# Patient Record
Sex: Male | Born: 1995 | Race: White | Hispanic: No | Marital: Married | Smoking: Never smoker
Health system: Southern US, Community
[De-identification: ages and names within clinical notes are randomized; demographics above are authoritative.]

## PROBLEM LIST (undated history)

## (undated) DIAGNOSIS — I10 Essential (primary) hypertension: Secondary | ICD-10-CM

## (undated) HISTORY — DX: Essential (primary) hypertension: I10

## (undated) HISTORY — PX: TYMPANOSTOMY TUBE PLACEMENT: SHX32

---
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
## 2017-02-23 MED FILL — LOSARTAN POTASSIUM 50 MG TA: 50 | 30 days supply | Qty: 30 | Fill #2

## 2017-05-05 MED FILL — LOSARTAN POTASSIUM 50 MG TA: 50 | 90 days supply | Qty: 90 | Fill #0

## 2017-05-15 DIAGNOSIS — J029 Acute pharyngitis, unspecified: Secondary | ICD-10-CM | POA: Diagnosis not present

## 2017-05-23 DIAGNOSIS — I10 Essential (primary) hypertension: Secondary | ICD-10-CM | POA: Diagnosis not present

## 2017-05-23 DIAGNOSIS — H5213 Myopia, bilateral: Secondary | ICD-10-CM | POA: Diagnosis not present

## 2017-05-23 DIAGNOSIS — Z6830 Body mass index (BMI) 30.0-30.9, adult: Secondary | ICD-10-CM | POA: Diagnosis not present

## 2018-01-12 MED FILL — LOSARTAN POTASSIUM 50 MG TA: 50 | 90 days supply | Qty: 90 | Fill #1

## 2018-07-24 DIAGNOSIS — H5213 Myopia, bilateral: Secondary | ICD-10-CM | POA: Diagnosis not present

## 2018-07-24 DIAGNOSIS — I10 Essential (primary) hypertension: Secondary | ICD-10-CM | POA: Diagnosis not present

## 2018-07-24 DIAGNOSIS — H52203 Unspecified astigmatism, bilateral: Secondary | ICD-10-CM | POA: Diagnosis not present

## 2018-07-24 MED FILL — LOSARTAN POTASSIUM 50 MG TA: 50 | 90 days supply | Qty: 90 | Fill #0

## 2018-10-21 IMAGING — US US RENAL
1 series · 14 of 25 positions shown · non-contrast
Comparison: None.

CLINICAL DATA: Hypertension.

EXAM:
RENAL/URINARY TRACT ULTRASOUND
RENAL DUPLEX DOPPLER ULTRASOUND

[Series 1: us renal · 0.32mm/px · 14 of 65 slices shown]
[im 1/65]
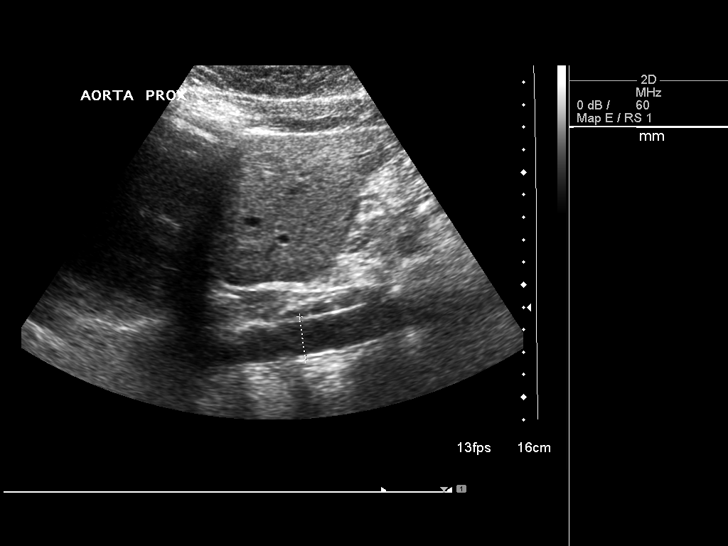
[im 6/65]
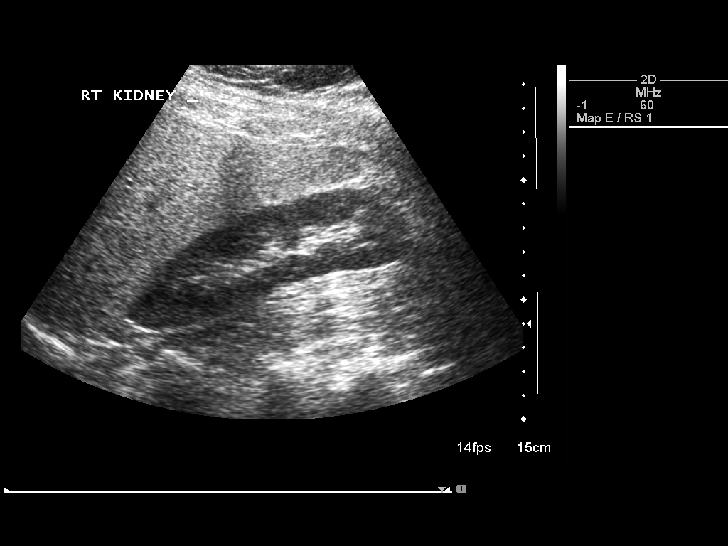
[im 11/65]
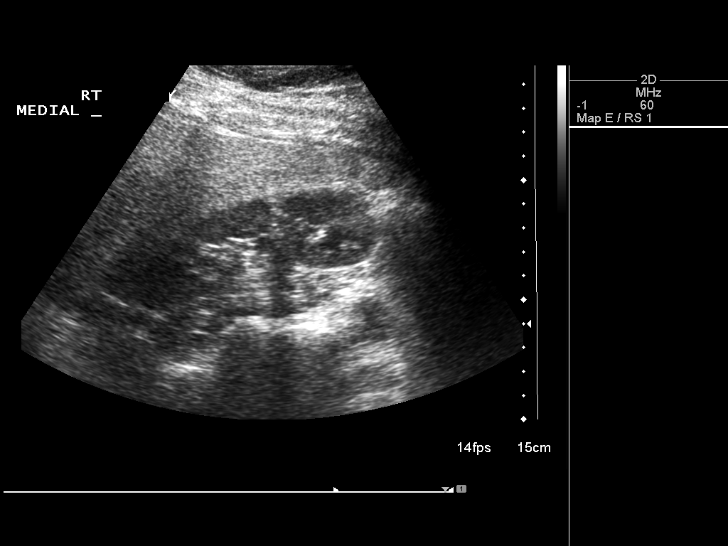
[im 17/65]
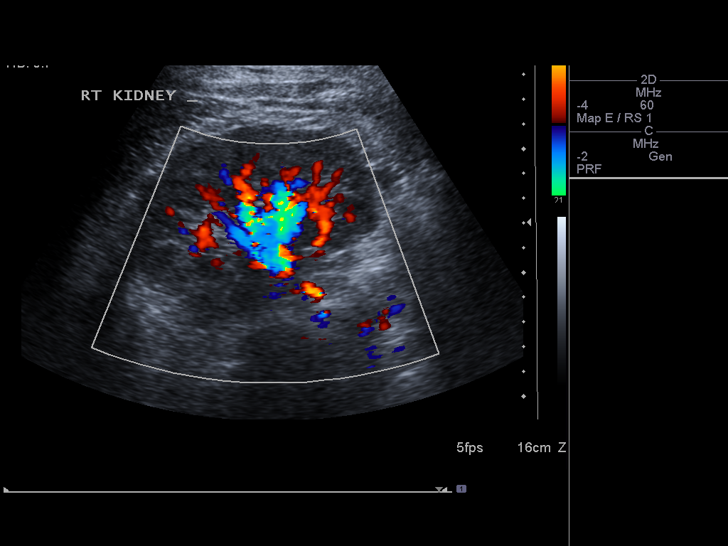
[im 22/65]
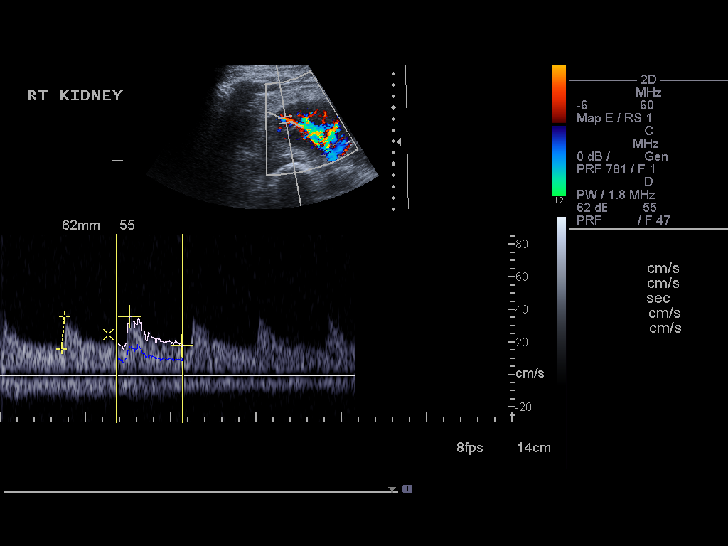
[im 25/65]
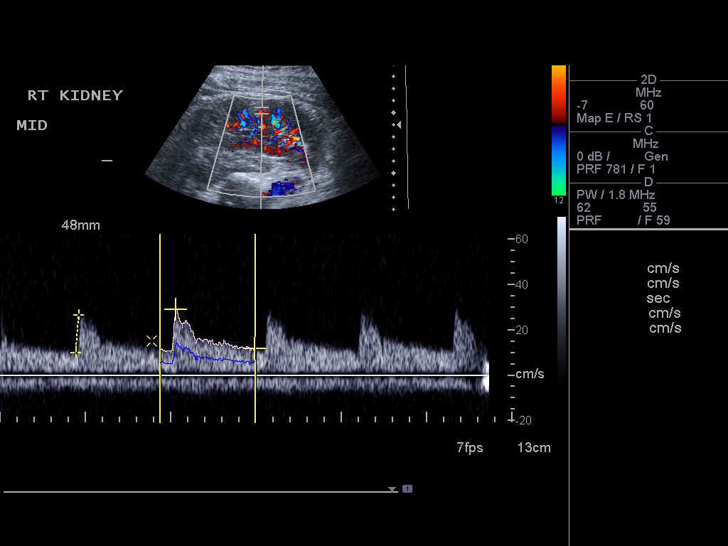
[im 30/65]
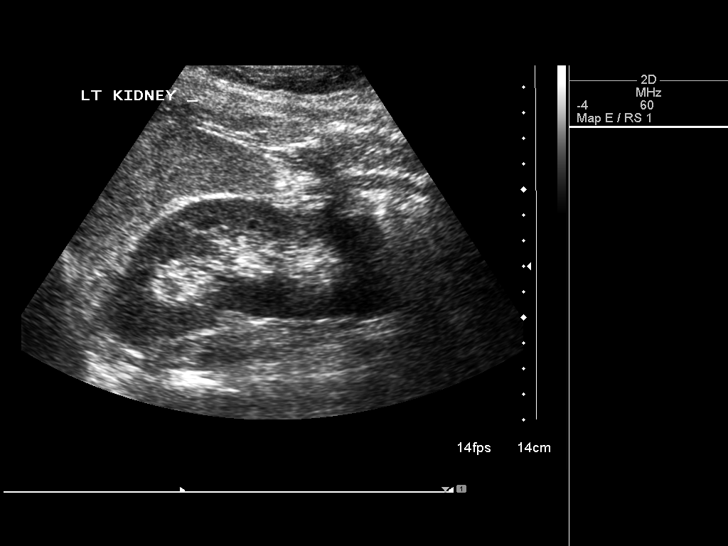
[im 35/65]
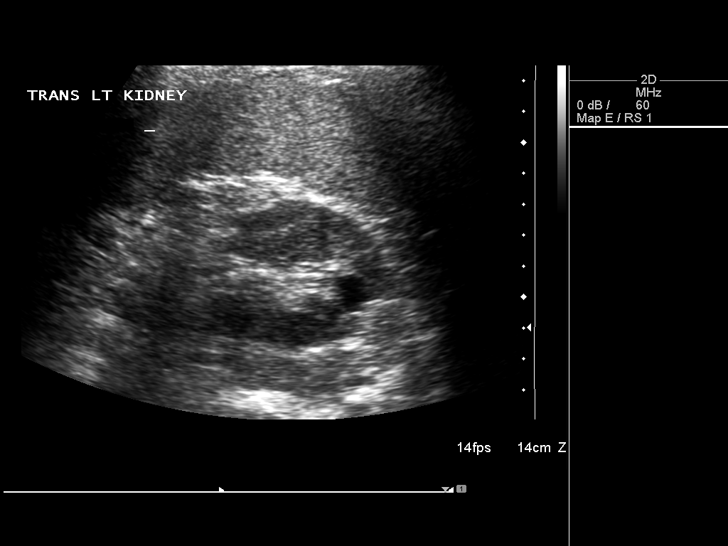
[im 41/65]
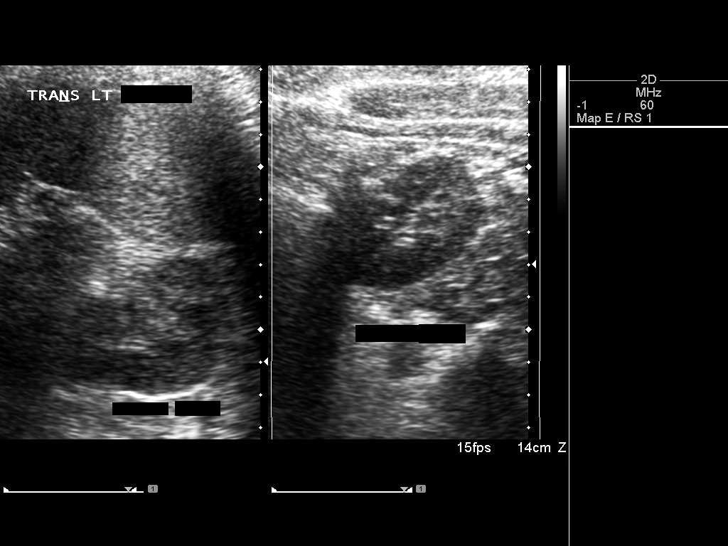
[im 43/65]
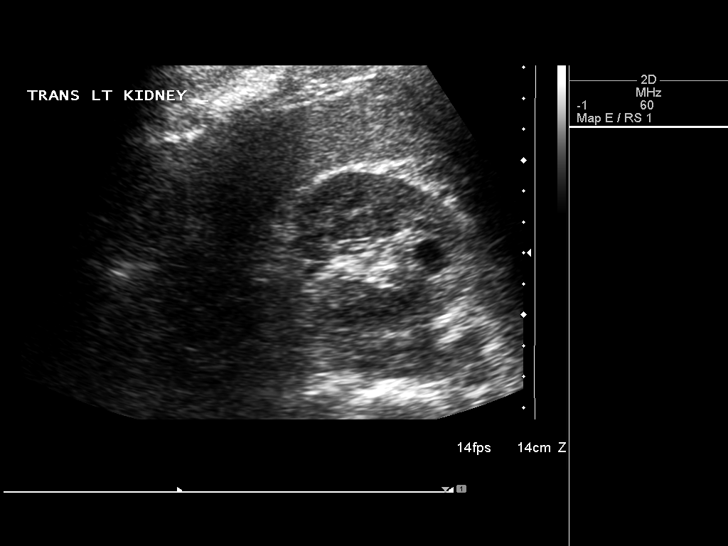
[im 49/65]
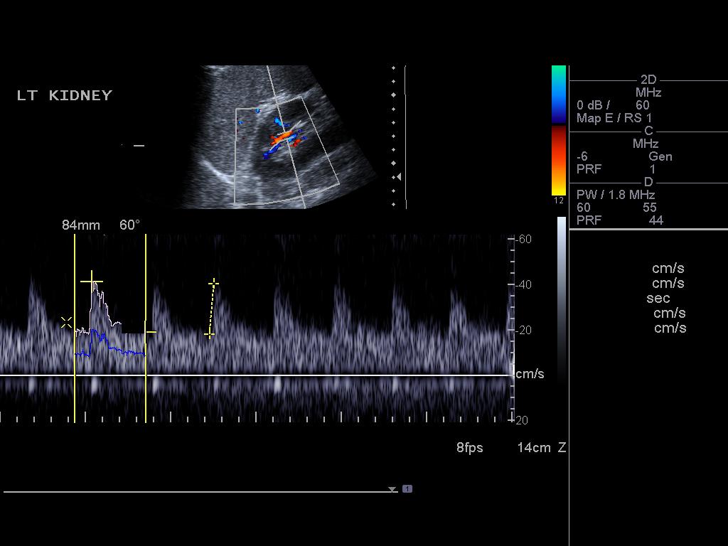
[im 54/65]
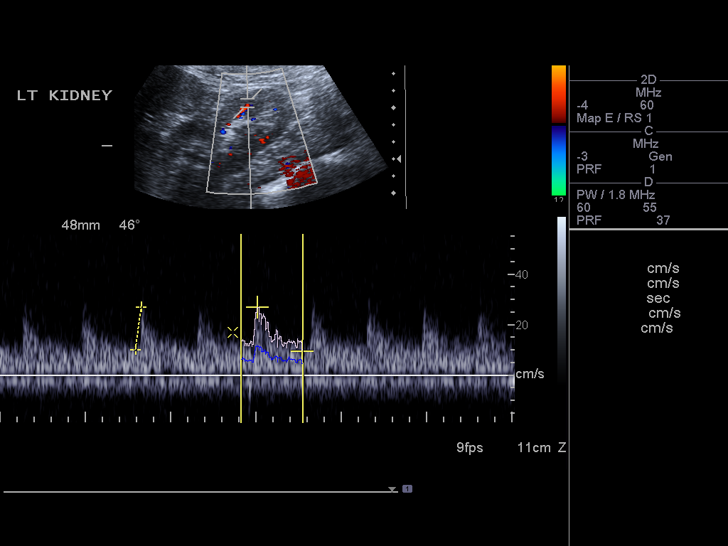
[im 59/65]
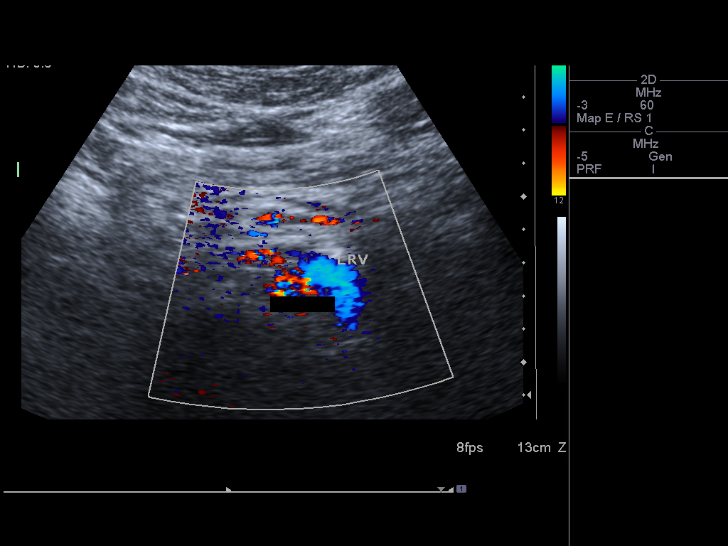
[im 65/65]
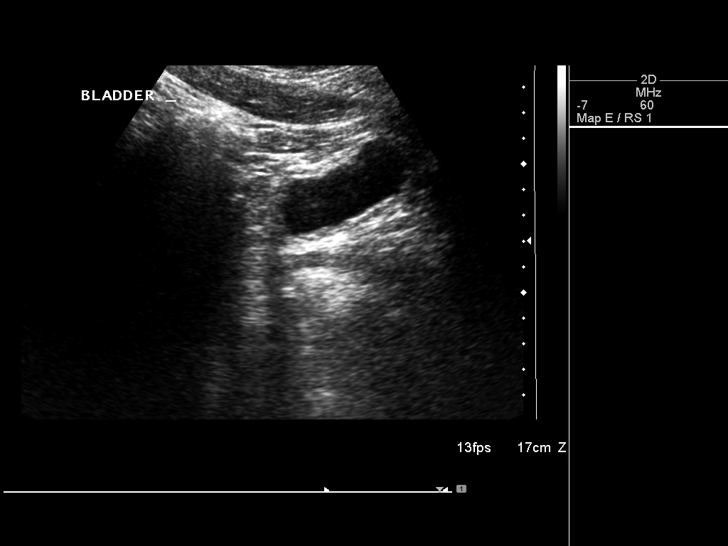

[14 of 25 positions shown; findings below may reference images not displayed]

FINDINGS: Right Kidney:

Length: 12 cm. Echogenicity within normal limits. No mass or
hydronephrosis visualized.

Left Kidney:

Length: 11.8 cm. 1.1 cm simple cyst is seen in upper pole.
Echogenicity within normal limits. No mass or hydronephrosis
visualized.

Bladder:  Within normal limits for degree of distension.

RENAL DUPLEX ULTRASOUND

Right Renal Artery Velocities:

Origin:  111 cm/sec

Mid:  120 cm/sec

Hilum:  126 cm/sec

Interlobar:  45 cm/sec

Arcuate:  36 Cm/sec

Left Renal Artery Velocities:

Origin:  132 cm/sec

Mid:  67 cm/sec

Hilum:  97 cm/sec

Interlobar:  41 cm/sec

Arcuate:  27 cm/sec

Aortic Velocity:  174 Cm/sec

Right Renal-Aortic Ratios:

Origin:

Mid:

Hilum:

Interlobar:

Arcuate:

Left Renal-Aortic Ratios:

Origin:

Mid:

Hilum:

Interlobar:

Arcuate:
IMPRESSION: No significant abnormality seen involving either kidney. No Doppler
evidence of renal artery stenosis.

## 2018-12-21 MED FILL — LOSARTAN POTASSIUM 50 MG TA: 50 | 90 days supply | Qty: 90 | Fill #1

## 2019-03-07 DIAGNOSIS — R202 Paresthesia of skin: Secondary | ICD-10-CM | POA: Diagnosis not present

## 2019-03-07 DIAGNOSIS — R200 Anesthesia of skin: Secondary | ICD-10-CM | POA: Diagnosis not present

## 2019-03-07 DIAGNOSIS — R0982 Postnasal drip: Secondary | ICD-10-CM | POA: Diagnosis not present

## 2019-03-07 DIAGNOSIS — M542 Cervicalgia: Secondary | ICD-10-CM | POA: Diagnosis not present

## 2019-03-08 DIAGNOSIS — Z20828 Contact with and (suspected) exposure to other viral communicable diseases: Secondary | ICD-10-CM | POA: Diagnosis not present

## 2019-04-02 MED FILL — LOSARTAN POTASSIUM 50 MG TA: 50 | 90 days supply | Qty: 90 | Fill #2 | Status: TO

## 2019-09-18 DIAGNOSIS — I10 Essential (primary) hypertension: Secondary | ICD-10-CM | POA: Diagnosis not present

## 2019-10-03 MED FILL — LOSARTAN POTASSIUM 50 MG TA: 50 | 90 days supply | Qty: 90 | Fill #0

## 2020-01-24 MED FILL — LOSARTAN POTASSIUM 50 MG TA: 50 | 90 days supply | Qty: 90 | Fill #1

## 2020-03-07 DIAGNOSIS — Z20822 Contact with and (suspected) exposure to covid-19: Secondary | ICD-10-CM | POA: Diagnosis not present

## 2020-03-07 DIAGNOSIS — Z03818 Encounter for observation for suspected exposure to other biological agents ruled out: Secondary | ICD-10-CM | POA: Diagnosis not present

## 2020-06-03 ENCOUNTER — Other Ambulatory Visit (HOSPITAL_COMMUNITY): Payer: Self-pay | Admitting: Nurse Practitioner

## 2020-06-03 MED FILL — LOSARTAN POTASSIUM 50 MG TA: 50 | 30 days supply | Qty: 30 | Fill #0

## 2020-07-07 MED FILL — LOSARTAN POTASSIUM 50 MG TA: 50 | 30 days supply | Qty: 30 | Fill #1

## 2020-09-07 MED FILL — Losartan Potassium Tab 50 MG: ORAL | 30 days supply | Qty: 30 | Fill #0 | Status: AC

## 2020-09-08 ENCOUNTER — Other Ambulatory Visit (HOSPITAL_COMMUNITY): Payer: Self-pay

## 2020-09-09 ENCOUNTER — Other Ambulatory Visit (HOSPITAL_COMMUNITY): Payer: Self-pay

## 2020-09-25 DIAGNOSIS — I10 Essential (primary) hypertension: Secondary | ICD-10-CM | POA: Diagnosis not present

## 2020-10-14 DIAGNOSIS — H52223 Regular astigmatism, bilateral: Secondary | ICD-10-CM | POA: Diagnosis not present

## 2020-11-03 ENCOUNTER — Other Ambulatory Visit (HOSPITAL_COMMUNITY): Payer: Self-pay

## 2021-07-31 DIAGNOSIS — J029 Acute pharyngitis, unspecified: Secondary | ICD-10-CM | POA: Diagnosis not present

## 2021-07-31 DIAGNOSIS — Z20822 Contact with and (suspected) exposure to covid-19: Secondary | ICD-10-CM | POA: Diagnosis not present

## 2021-12-01 DIAGNOSIS — E875 Hyperkalemia: Secondary | ICD-10-CM | POA: Diagnosis not present

## 2021-12-01 DIAGNOSIS — I10 Essential (primary) hypertension: Secondary | ICD-10-CM | POA: Diagnosis not present

## 2021-12-01 DIAGNOSIS — Z6838 Body mass index (BMI) 38.0-38.9, adult: Secondary | ICD-10-CM | POA: Diagnosis not present

## 2022-01-21 ENCOUNTER — Other Ambulatory Visit (HOSPITAL_COMMUNITY): Payer: Self-pay

## 2022-01-21 MED ORDER — LOSARTAN POTASSIUM 50 MG PO TABS
50.0000 mg | ORAL_TABLET | Freq: Every day | ORAL | 3 refills | Status: DC
  Filled 2022-01-21: qty 90, 90d supply, fill #0
  Filled 2022-07-30: qty 90, 90d supply, fill #1
  Filled 2022-11-30: qty 90, 90d supply, fill #2
  Filled 2022-11-30: qty 90, 90d supply, fill #0

## 2022-01-28 ENCOUNTER — Other Ambulatory Visit (HOSPITAL_COMMUNITY): Payer: Self-pay

## 2022-02-12 DIAGNOSIS — Z6836 Body mass index (BMI) 36.0-36.9, adult: Secondary | ICD-10-CM | POA: Diagnosis not present

## 2022-02-12 DIAGNOSIS — J029 Acute pharyngitis, unspecified: Secondary | ICD-10-CM | POA: Diagnosis not present

## 2022-02-12 DIAGNOSIS — Z03818 Encounter for observation for suspected exposure to other biological agents ruled out: Secondary | ICD-10-CM | POA: Diagnosis not present

## 2022-08-02 ENCOUNTER — Other Ambulatory Visit (HOSPITAL_COMMUNITY): Payer: Self-pay

## 2022-08-06 ENCOUNTER — Emergency Department: Payer: BC Managed Care – PPO

## 2022-08-06 ENCOUNTER — Emergency Department
Admission: EM | Admit: 2022-08-06 | Discharge: 2022-08-07 | Disposition: A | Discharge status: Home / Self Care | Payer: BC Managed Care – PPO | Attending: Emergency Medicine | Admitting: Emergency Medicine

## 2022-08-06 DIAGNOSIS — Z79899 Other long term (current) drug therapy: Secondary | ICD-10-CM | POA: Diagnosis not present

## 2022-08-06 DIAGNOSIS — I10 Essential (primary) hypertension: Secondary | ICD-10-CM | POA: Diagnosis not present

## 2022-08-06 DIAGNOSIS — R202 Paresthesia of skin: Secondary | ICD-10-CM | POA: Diagnosis not present

## 2022-08-06 DIAGNOSIS — E876 Hypokalemia: Secondary | ICD-10-CM | POA: Insufficient documentation

## 2022-08-06 DIAGNOSIS — R200 Anesthesia of skin: Secondary | ICD-10-CM | POA: Diagnosis present

## 2022-08-06 LAB — URINALYSIS, ROUTINE W REFLEX MICROSCOPIC
Bilirubin Urine: NEGATIVE
Glucose, UA: NEGATIVE mg/dL
Hgb urine dipstick: NEGATIVE
Ketones, ur: NEGATIVE mg/dL
Leukocytes,Ua: NEGATIVE
Nitrite: NEGATIVE
Protein, ur: NEGATIVE mg/dL
Specific Gravity, Urine: 1.017 (ref 1.005–1.030)
pH: 7 (ref 5.0–8.0)

## 2022-08-06 LAB — BASIC METABOLIC PANEL
Anion gap: 12 (ref 5–15)
BUN: 8 mg/dL (ref 6–20)
CO2: 26 mmol/L (ref 22–32)
Calcium: 9.5 mg/dL (ref 8.9–10.3)
Chloride: 105 mmol/L (ref 98–111)
Creatinine, Ser: 0.96 mg/dL (ref 0.61–1.24)
GFR, Estimated: >60 mL/min (ref >60)
Glucose, Bld: 103 mg/dL — ABNORMAL HIGH (ref 70–99)
Potassium: 3.1 mmol/L — ABNORMAL LOW (ref 3.5–5.1)
Sodium: 143 mmol/L (ref 135–145)

## 2022-08-06 LAB — CBC
HCT: 48.7 % (ref 39.0–52.0)
Hemoglobin: 16.2 g/dL (ref 13.0–17.0)
MCH: 28.5 pg (ref 26.0–34.0)
MCHC: 33.3 g/dL (ref 30.0–36.0)
MCV: 85.6 fL (ref 80.0–100.0)
Platelets: 264 10*3/uL (ref 150–400)
RBC: 5.69 MIL/uL (ref 4.22–5.81)
RDW: 13.1 % (ref 11.5–15.5)
WBC: 10.7 10*3/uL — ABNORMAL HIGH (ref 4.0–10.5)
nRBC: 0 % (ref 0.0–0.2)

## 2022-08-06 LAB — MAGNESIUM: Magnesium: 2.1 mg/dL (ref 1.7–2.4)

## 2022-08-06 LAB — CBG MONITORING, ED: Glucose-Capillary: 104 mg/dL — ABNORMAL HIGH (ref 70–99)

## 2022-08-06 MED ORDER — POTASSIUM CHLORIDE 20 MEQ PO PACK
40.0000 meq | PACK | Freq: Once | ORAL | Status: AC
  Administered 2022-08-07: 40 meq via ORAL
  Filled 2022-08-06: qty 2

## 2022-08-06 MED ORDER — POTASSIUM CHLORIDE CRYS ER 20 MEQ PO TBCR
40.0000 meq | EXTENDED_RELEASE_TABLET | Freq: Once | ORAL | Status: AC
  Administered 2022-08-06: 40 meq via ORAL
  Filled 2022-08-06: qty 2

## 2022-08-06 NOTE — ED Triage Notes (Addendum)
Patient reports numbness in tingling in his left face, arm and foot. Patient reports it started at [place] today. Last KNW 1900. Patient reports very bad headache on Tuesday. No headache today. No Blood thinners. Losartan to manage BP.

## 2022-08-06 NOTE — ED Provider Notes (Signed)
[facility] Provider Note    Event Date/Time   First MD Initiated Contact with Patient 08/06/22 2305     (approximate)   History   Numbness   HPI  Timothy Young is a 27 y.o. male who presents to the ED for evaluation of Numbness   Obese patient with history of hypertension on losartan presents to the ED with his father for evaluation of a few hours of paresthesias in the left side of his body.  Symptoms started gradually this evening after dinner.  He reports paresthesias to his left hand coming up his left arm and a similar sensation to the left side of his face and neck.  No falls, injuries and no back or neck pain.  No weakness, true numbness or gait changes.  Physical Exam   Triage Vital Signs: ED Triage Vitals [08/06/22 2122]  Enc Vitals Group     BP (!) 165/95     Pulse Rate 98     Resp 18     Temp (!) 97.5 F (36.4 C)     Temp Source Oral     SpO2 100 %     Weight 278 lb (126.1 kg)     Height 6\' 1"  (1.854 m)     Head Circumference      Peak Flow      Pain Score      Pain Loc      Pain Edu?      Excl. in [place]?     Most recent vital signs: Vitals:   08/06/22 2330 08/07/22 0000  BP: (!) 158/103 (!) 145/89  Pulse:  95  Resp:    Temp:    SpO2:  95%    General: Awake, no distress.  CV:  Good peripheral perfusion.  Resp:  Normal effort.  Abd:  No distention.  MSK:  No deformity noted.  Neuro:  No focal deficits appreciated. Cranial nerves II through XII intact 5/5 strength and sensation in all 4 extremities Other:     ED Results / Procedures / Treatments   Labs (all labs ordered are listed, but only abnormal results are displayed) Labs Reviewed  BASIC METABOLIC PANEL - Abnormal; Notable for the following components:      Result Value   Potassium 3.1 (*)    Glucose, Bld 103 (*)    All other components within normal limits  CBC - Abnormal; Notable for the following components:   WBC 10.7 (*)    All other components  within normal limits  URINALYSIS, ROUTINE W REFLEX MICROSCOPIC - Abnormal; Notable for the following components:   Color, Urine YELLOW (*)    APPearance CLEAR (*)    All other components within normal limits  CBG MONITORING, ED - Abnormal; Notable for the following components:   Glucose-Capillary 104 (*)    All other components within normal limits  MAGNESIUM    EKG Sinus rhythm at a rate of 101 bpm.  Normal axis and intervals.  Nonspecific ST changes without clear signs of acute ischemia.  RADIOLOGY CT head interpreted by me without evidence of acute intracranial pathology  Official radiology report(s): CT HEAD WO CONTRAST  Result Date: 08/06/2022 CLINICAL DATA:  Numbness and tingling, paresthesia. EXAM: CT HEAD WITHOUT CONTRAST TECHNIQUE: Contiguous axial images were obtained from the base of the skull through the vertex without intravenous contrast. RADIATION DOSE REDUCTION: This exam was performed according to the departmental dose-optimization program which includes automated exposure control, adjustment of the  mA and/or kV according to patient size and/or use of iterative reconstruction technique. COMPARISON:  None Available. FINDINGS: Brain: No evidence of acute infarction, hemorrhage, hydrocephalus, extra-axial collection or mass lesion/mass effect. Vascular: No hyperdense vessel or unexpected calcification. Skull: Normal. Negative for fracture or focal lesion. Sinuses/Orbits: No acute finding. Other: None. IMPRESSION: No acute intracranial pathology. Electronically Signed   By: Keane Police D.O.   On: 08/06/2022 21:43    PROCEDURES and INTERVENTIONS:  .1-3 Lead EKG Interpretation  Performed by: Vladimir Crofts, MD Authorized by: Vladimir Crofts, MD     Interpretation: normal     ECG rate:  92   ECG rate assessment: normal     Rhythm: sinus rhythm     Ectopy: none     Conduction: normal     Medications  potassium chloride SA (KLOR-CON M) CR tablet 40 mEq (40 mEq Oral Given  08/06/22 2320)  potassium chloride (KLOR-CON) packet 40 mEq (40 mEq Oral Given 08/07/22 0005)     IMPRESSION / MDM / ASSESSMENT AND PLAN / ED COURSE  I reviewed the triage vital signs and the nursing notes.  Differential diagnosis includes, but is not limited to, stroke, ICH, electrolyte derangement, anxiety, MSK spasm, radiculopathy  {Patient presents with symptoms of an acute illness or injury that is potentially life-threatening.  Generally healthy 27 year old presents to the ED with paresthesias and sensorium changes, likely related to hypokalemia.  He look systemically well and has a reassuring neurologic examination to me.  He has intact sensation to light touch throughout but reports tingling and altered sensorium alongside this consistent with paresthesias more so than numbness.  No weakness, cranial nerve deficits or concerning features.  We will replace potassium and reassess.  Anticipate outpatient management.  Clinical Course as of 08/07/22 0456  Sat Aug 07, 2022  0032 Reassessed.  Feeling better.  We discussed expectant management and return precautions. [DS]    Clinical Course User Index [DS] Vladimir Crofts, MD     FINAL CLINICAL IMPRESSION(S) / ED DIAGNOSES   Final diagnoses:  Paresthesia  Hypokalemia     Rx / DC Orders   ED Discharge Orders     None        Note:  This document was prepared using Dragon voice recognition software and may include unintentional dictation errors.   Vladimir Crofts, MD 08/07/22 [phone]

## 2022-11-30 ENCOUNTER — Other Ambulatory Visit: Payer: Self-pay

## 2022-11-30 ENCOUNTER — Other Ambulatory Visit (HOSPITAL_COMMUNITY): Payer: Self-pay

## 2023-04-28 ENCOUNTER — Other Ambulatory Visit: Payer: Self-pay

## 2023-04-28 ENCOUNTER — Encounter: Payer: Self-pay | Admitting: Nurse Practitioner

## 2023-04-28 ENCOUNTER — Ambulatory Visit: Payer: BC Managed Care – PPO | Admitting: Nurse Practitioner

## 2023-04-28 VITALS — BP 126/88 | HR 99 | Temp 97.7°F | Ht 71.0 in | Wt 285.0 lb

## 2023-04-28 DIAGNOSIS — I10 Essential (primary) hypertension: Secondary | ICD-10-CM

## 2023-04-28 DIAGNOSIS — Z114 Encounter for screening for human immunodeficiency virus [HIV]: Secondary | ICD-10-CM | POA: Diagnosis not present

## 2023-04-28 DIAGNOSIS — Z13220 Encounter for screening for lipoid disorders: Secondary | ICD-10-CM

## 2023-04-28 DIAGNOSIS — Z1159 Encounter for screening for other viral diseases: Secondary | ICD-10-CM

## 2023-04-28 DIAGNOSIS — Z0000 Encounter for general adult medical examination without abnormal findings: Secondary | ICD-10-CM | POA: Diagnosis not present

## 2023-04-28 LAB — COMPREHENSIVE METABOLIC PANEL
ALT: 35 U/L (ref 0–53)
AST: 19 U/L (ref 0–37)
Albumin: 4.8 g/dL (ref 3.5–5.2)
Alkaline Phosphatase: 81 U/L (ref 39–117)
BUN: 9 mg/dL (ref 6–23)
CO2: 30 meq/L (ref 19–32)
Calcium: 9.2 mg/dL (ref 8.4–10.5)
Chloride: 100 meq/L (ref 96–112)
Creatinine, Ser: 0.79 mg/dL (ref 0.40–1.50)
GFR: 123.48 mL/min (ref >60.00)
Glucose, Bld: 98 mg/dL (ref 70–99)
Potassium: 3.7 meq/L (ref 3.5–5.1)
Sodium: 140 meq/L (ref 135–145)
Total Bilirubin: 0.4 mg/dL (ref 0.2–1.2)
Total Protein: 7.3 g/dL (ref 6.0–8.3)

## 2023-04-28 LAB — CBC
HCT: 44 % (ref 39.0–52.0)
Hemoglobin: 15.3 g/dL (ref 13.0–17.0)
MCHC: 34.8 g/dL (ref 30.0–36.0)
MCV: 84.5 fL (ref 78.0–100.0)
Platelets: 229 10*3/uL (ref 150.0–400.0)
RBC: 5.2 Mil/uL (ref 4.22–5.81)
RDW: 13.9 % (ref 11.5–15.5)
WBC: 8.8 10*3/uL (ref 4.0–10.5)

## 2023-04-28 LAB — LIPID PANEL
Cholesterol: 168 mg/dL (ref 0–200)
HDL: 25.9 mg/dL — ABNORMAL LOW (ref >39.00)
LDL Cholesterol: 77 mg/dL (ref 0–99)
NonHDL: 141.88
Total CHOL/HDL Ratio: 6
Triglycerides: 322 mg/dL — ABNORMAL HIGH (ref 0.0–149.0)
VLDL: 64.4 mg/dL — ABNORMAL HIGH (ref 0.0–40.0)

## 2023-04-28 LAB — HEMOGLOBIN A1C: Hgb A1c MFr Bld: 5.4 % (ref 4.6–6.5)

## 2023-04-28 LAB — TSH: TSH: 2.15 u[IU]/mL (ref 0.35–5.50)

## 2023-04-28 MED ORDER — LOSARTAN POTASSIUM 50 MG PO TABS
50.0000 mg | ORAL_TABLET | Freq: Every day | ORAL | 3 refills | Status: AC
  Filled 2023-04-28 – 2023-05-31 (×2): qty 90, 90d supply, fill #0
  Filled 2023-11-04: qty 90, 90d supply, fill #1
  Filled 2024-02-27: qty 90, 90d supply, fill #2

## 2023-04-28 NOTE — Assessment & Plan Note (Signed)
Did discuss age-appropriate immunizations and screening exams.  Reviewed patient's personal, surgical, social, family histories.  Patient up-to-date on all age-appropriate vaccinations he would like.  Patient is too young for CRC screening or prostate cancer screening.  Patient is in a committed monogamous relationship and declines STI testing today.  Patient was given information at discharge about preventative healthcare maintenance with anticipatory guidance.

## 2023-04-28 NOTE — Assessment & Plan Note (Signed)
Pending A1c, lipid panel, thyroid.  Did discuss healthy lifestyle modifications.  Did review with patient medical recommendation of exercise 30 minutes a day 5 times a week.

## 2023-04-28 NOTE — Assessment & Plan Note (Signed)
Patient currently maintained on losartan 50 mg daily.  Blood pressure well-controlled in office.  Continue medication as prescribed

## 2023-04-28 NOTE — Progress Notes (Signed)
New Patient Office Visit  Subjective    Patient ID: Timothy Young, male    DOB: [birth year]  Age: 27 y.o. MRN: [id number]  CC:  Chief Complaint  Patient presents with   Establish Care    Pt complains of going to kidney specialist for high BP who will not refill BP medications. Pt would like to establish care to get refills.     HPI LC OBLANDER presents to establish care   HTN: does have wrist cuff that he will check on occasionally.  He does well on losartan   for complete physical and follow up of chronic conditions.  Immunizations: -Tetanus: Completed in 2023 -Influenza: at [facility] this season -Shingles: Too young -Pneumonia: Too young -covid: original series   Diet: Fair diet. He will eat 2 meals a day. States that sometimes he will snacks. He will drink soda and propel. Working on cutting down on sodas  Exercise: No regular exercise. States that they were walking in the evening. States has slow down with weather and night  Eye exam: Completes annually. Glasses. Needs updating   Dental exam: Completes semi-annually    Colonoscopy: Too young, currently average risk Lung Cancer Screening: N/A  PSA: Too young, currently average risk  Sleep: states he will go to bed 1030-12 and get up 7-8. Feels rested mostly. Does snore      Outpatient Encounter Medications as of 04/28/2023  Medication Sig   [DISCONTINUED] losartan (COZAAR) 50 MG tablet Take 1 tablet (50 mg total) by mouth daily.   losartan (COZAAR) 50 MG tablet Take 1 tablet (50 mg total) by mouth daily.   [DISCONTINUED] ibuprofen (ADVIL,MOTRIN) 200 MG tablet Take 600-800 mg by mouth every 6 (six) hours as needed (pain).   [DISCONTINUED] losartan (COZAAR) 50 MG tablet TAKE 1 TABLET BY MOUTH DAILY   No facility-administered encounter medications on file as of 04/28/2023.    Past Medical History:  Diagnosis Date   Hypertension     Past Surgical History:  Procedure Laterality Date    TYMPANOSTOMY TUBE PLACEMENT      Family History  Problem Relation Age of Onset   Cancer Mother 39       breast   Hypertension Mother    Hypertension Father    Multiple sclerosis Father    Cancer Maternal Grandfather    Mental illness Maternal Grandfather    Hyperlipidemia Paternal Grandmother    Miscarriages / Stillbirths Paternal Grandmother    Alcohol abuse Neg Hx    Arthritis Neg Hx    Asthma Neg Hx    Birth defects Neg Hx    COPD Neg Hx    Depression Neg Hx    Early death Neg Hx    Hearing loss Neg Hx    Heart disease Neg Hx    Kidney disease Neg Hx    Learning disabilities Neg Hx    Mental retardation Neg Hx    Stroke Neg Hx    Vision loss Neg Hx    Varicose Veins Neg Hx     Social History   Socioeconomic History   Marital status: Married    Spouse name: Toni Amend   Number of children: 1   Years of education: Not on file   Highest education level: Not on file  Occupational History   Not on file  Tobacco Use   Smoking status: Never   Smokeless tobacco: Never  Vaping Use   Vaping status: Never Used  Substance and Sexual Activity  Alcohol use: No   Drug use: Not Currently   Sexual activity: Never  Other Topics Concern   Not on file  Social History Narrative   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  Respiratory:  Negative for shortness of breath.   Cardiovascular:  Negative for chest pain, palpitations and leg swelling.  Gastrointestinal:  Negative for abdominal pain, blood in stool, constipation, diarrhea, nausea and vomiting.       Bm daily   Genitourinary:  Negative for dysuria and  hematuria.  Neurological:  Negative for tingling and headaches.  Psychiatric/Behavioral:  Negative for hallucinations and suicidal ideas.         Objective    BP 126/88   Pulse 99   Temp 97.7 F (36.5 C) (Oral)   Ht 5\' 11"  (1.803 m)   Wt 285 lb (129.3 kg)   SpO2 99%   BMI 39.75 kg/m   Physical Exam Vitals and nursing note reviewed.  Constitutional:      Appearance: Normal appearance.  HENT:     Right Ear: Tympanic membrane, ear canal and external ear normal.     Left Ear: Tympanic membrane, ear canal and external ear normal.     Mouth/Throat:     Mouth: Mucous membranes are moist.     Pharynx: Oropharynx is clear.  Eyes:     Extraocular Movements: Extraocular movements intact.     Pupils: Pupils are equal, round, and reactive to light.  Cardiovascular:     Rate and Rhythm: Normal rate and regular rhythm.     Pulses: Normal pulses.     Heart sounds: Normal heart sounds.  Pulmonary:     Effort: Pulmonary effort is normal.     Breath sounds: Normal breath sounds.  Abdominal:     General: Bowel sounds are normal. There is no distension.     Palpations: There is no mass.     Tenderness: There is no abdominal tenderness.     Hernia: No hernia is present.  Musculoskeletal:     Right lower leg: No edema.     Left lower leg: No edema.  Lymphadenopathy:     Cervical: No cervical adenopathy.  Skin:    General: Skin is warm.  Neurological:     General: No focal deficit present.     Mental Status: He is alert.     Deep Tendon Reflexes:     Reflex Scores:      Bicep reflexes are 2+ on the right side and 2+ on the left side.      Patellar reflexes are 2+ on the right side and 2+ on the left side.    Comments: Bilateral upper and lower extremity strength 5/5  Psychiatric:        Mood and Affect: Mood normal.        Behavior: Behavior normal.        Thought Content: Thought content normal.        Judgment: Judgment normal.         Assessment & Plan:   Problem  List  Items Addressed This Visit       Cardiovascular and Mediastinum   Primary hypertension   Patient currently maintained on losartan 50 mg daily.  Blood pressure well-controlled in office.  Continue medication as prescribed      Relevant Medications   losartan (COZAAR) 50 MG tablet   Other Relevant Orders   CBC   Comprehensive metabolic panel   TSH     Other   Preventative health care - Primary   Did discuss age-appropriate immunizations and screening exams.  Reviewed patient's personal, surgical, social, family histories.  Patient up-to-date on all age-appropriate vaccinations he would like.  Patient is too young for CRC screening or prostate cancer screening.  Patient is in a committed monogamous relationship and declines STI testing today.  Patient was given information at discharge about preventative healthcare maintenance with anticipatory guidance.      Relevant Orders   CBC   Comprehensive metabolic panel   TSH   Morbid obesity (HCC)   Pending A1c, lipid panel, thyroid.  Did discuss healthy lifestyle modifications.  Did review with patient medical recommendation of exercise 30 minutes a day 5 times a week.      Relevant Orders   Hemoglobin A1c   TSH   Other Visit Diagnoses       Encounter for hepatitis C screening test for low risk patient       Relevant Orders   Hepatitis C Antibody     Encounter for screening for HIV       Relevant Orders   HIV antibody (with reflex)     Screening for lipid disorders       Relevant Orders   Lipid panel       Return in about 1 year (around 04/27/2024) for CPE and Labs.   Audria Nine, NP

## 2023-04-28 NOTE — Patient Instructions (Addendum)
Nice to see you today I will be in touch with the labs once I have reviewed them Follow up with me in 1 year, sooner if you need me

## 2023-04-29 LAB — HIV ANTIBODY (ROUTINE TESTING W REFLEX): HIV 1&2 Ab, 4th Generation: NONREACTIVE

## 2023-04-29 LAB — HEPATITIS C ANTIBODY: Hepatitis C Ab: NONREACTIVE

## 2023-05-09 ENCOUNTER — Other Ambulatory Visit: Payer: Self-pay

## 2023-05-25 ENCOUNTER — Other Ambulatory Visit: Payer: Self-pay

## 2023-05-31 ENCOUNTER — Other Ambulatory Visit: Payer: Self-pay

## 2023-06-07 ENCOUNTER — Ambulatory Visit: Payer: BC Managed Care – PPO | Admitting: Family Medicine

## 2023-06-07 NOTE — Progress Notes (Deleted)
New patient visit   Patient: Timothy Young   DOB: 10/16/1995   27 y.o. Male  MRN: [id number] Visit Date: 06/07/2023  Today's healthcare provider: Ronnald Ramp, MD   No chief complaint on file.  Subjective    Timothy Young is a 28 y.o. male who presents today as a new patient to establish care.      Discussed the use of AI scribe software for clinical note transcription with the patient, who gave verbal consent to proceed.  History of Present Illness           Last CPE 04/28/23  Needs Tetanus updated, last dose in 2020, [facility] printed*** needs Epic update   Past Medical History:  Diagnosis Date   Hypertension     Outpatient Medications Prior to Visit  Medication Sig   losartan (COZAAR) 50 MG tablet Take 1 tablet (50 mg total) by mouth daily.   No facility-administered medications prior to visit.    Past Surgical History:  Procedure Laterality Date   TYMPANOSTOMY TUBE PLACEMENT     Family Status  Relation Name Status   Mother Olegario Messier Alive   Father Johnny Alive   Sister Anna-5y older Alive   MGM  Alive   MGF  Alive   PGM  Alive   PGF  Alive   Neg Hx  (Not Specified)  No partnership data on file   Family History  Problem Relation Age of Onset   Cancer Mother 74       breast   Hypertension Mother    Hypertension Father    Multiple sclerosis Father    Cancer Maternal Grandfather    Mental illness Maternal Grandfather    Hyperlipidemia Paternal Grandmother    Miscarriages / Stillbirths Paternal Grandmother    Alcohol abuse Neg Hx    Arthritis Neg Hx    Asthma Neg Hx    Birth defects Neg Hx    COPD Neg Hx    Depression Neg Hx    Early death Neg Hx    Hearing loss Neg Hx    Heart disease Neg Hx    Kidney disease Neg Hx    Learning disabilities Neg Hx    Mental retardation Neg Hx    Stroke Neg Hx    Vision loss Neg Hx    Varicose Veins Neg Hx    Social History   Socioeconomic History   Marital status: Married    Spouse  name: Toni Amend   Number of children: 1   Years of education: Not on file   Highest education level: Not on file  Occupational History   Not on file  Tobacco Use   Smoking status: Never   Smokeless tobacco: Never  Vaping Use   Vaping status: Never Used  Substance and Sexual Activity   Alcohol use: No   Drug use: Not Currently   Sexual activity: Never  Other Topics Concern   Not on file  Social History Narrative   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 in April 2025   Social Drivers of Corporate investment banker Strain: Not on [employer] Insecurity: Not on file  Transportation Needs: Not on file  Physical Activity: Not on file  Stress: Not on file  Social Connections: Not on file     Allergies  Allergen Reactions   Penicillins Nausea And Vomiting    Has patient had a PCN reaction causing immediate rash, facial/tongue/throat  swelling, SOB or lightheadedness with hypotension:  No Has patient had a PCN reaction causing severe rash involving mucus membranes or skin necrosis: No Has patient had a PCN reaction that required hospitalization No Has patient had a PCN reaction occurring within the last 10 years: No If all of the above answers are "NO", then may proceed with Cephalosporin use.     Immunization History  Administered Date(s) Administered   DTaP 01/17/1998, 03/03/1998, 05/01/1998, 02/19/1999, 03/01/2002   HIB (PRP-OMP) 01/17/1998, 03/03/1998, 02/19/1999   HPV 9-valent 07/23/2015, 11/04/2015, 03/12/2016   Hepatitis A 08/17/2005, 12/02/2008   Hepatitis B 27-May-1995, 05/01/1998, 07/31/1998   IPV 01/17/1998, 03/03/1998, 11/19/1998, 03/01/2002   Influenza Nasal 03/10/2011, 03/20/2012   Influenza,Quad,Nasal, Live 03/07/2013, 03/13/2014   Influenza,inj,Quad PF,6+ Mos 03/12/2016   Influenza,inj,quad, With Preservative 03/13/2015   Influenza-Unspecified 02/15/2023   MMR 11/19/1998, 03/01/2002   Meningococcal Conjugate 12/02/2008, 07/23/2015    Pneumococcal Conjugate-13 11/19/1998, 02/19/1999   Tdap 12/02/2008   Varicella 11/19/1998, 08/17/2005    Health Maintenance  Topic Date Due   DTaP/Tdap/Td (7 - Td or Tdap) 12/03/2018   COVID-19 Vaccine (1 - 2024-25 season) 04/27/2024 (Originally 01/16/2023)   Pneumococcal Vaccine 19-53 Years old  Completed   INFLUENZA VACCINE  Completed   HPV VACCINES  Completed   Hepatitis C Screening  Completed   HIV Screening  Completed    Patient Care Team: Eden Emms, NP as PCP - General (Nurse Practitioner)  Review of Systems  Last CBC Lab Results  Component Value Date   WBC 8.8 04/28/2023   HGB 15.3 04/28/2023   HCT 44.0 04/28/2023   MCV 84.5 04/28/2023   MCH 28.5 08/06/2022   RDW 13.9 04/28/2023   PLT 229.0 04/28/2023   Last metabolic panel Lab Results  Component Value Date   GLUCOSE 98 04/28/2023   NA 140 04/28/2023   K 3.7 04/28/2023   CL 100 04/28/2023   CO2 30 04/28/2023   BUN 9 04/28/2023   CREATININE 0.79 04/28/2023   GFR 123.48 04/28/2023   CALCIUM 9.2 04/28/2023   PROT 7.3 04/28/2023   ALBUMIN 4.8 04/28/2023   BILITOT 0.4 04/28/2023   ALKPHOS 81 04/28/2023   AST 19 04/28/2023   ALT 35 04/28/2023   ANIONGAP 12 08/06/2022   Last lipids Lab Results  Component Value Date   CHOL 168 04/28/2023   HDL 25.90 (L) 04/28/2023   LDLCALC 77 04/28/2023   TRIG 322.0 (H) 04/28/2023   CHOLHDL 6 04/28/2023   Last hemoglobin A1c Lab Results  Component Value Date   HGBA1C 5.4 04/28/2023   Last thyroid functions Lab Results  Component Value Date   TSH 2.15 04/28/2023     {See past labs  Heme  Chem  Endocrine  Serology  Results Review (optional):1}   Objective    There were no vitals taken for this visit. BP Readings from Last 3 Encounters:  04/28/23 126/88  08/07/22 (!) 145/89  03/16/16 142/78   Wt Readings from Last 3 Encounters:  04/28/23 285 lb (129.3 kg)  08/06/22 278 lb (126.1 kg)  03/16/16 220 lb (99.8 kg) (97%, Z= 1.95)*   * Growth  percentiles are based on CDC (Boys, 2-20 Years) data.    {See vitals history (optional):1}    Depression Screen    04/28/2023   12:33 PM 07/23/2015   10:41 PM  PHQ 2/9 Scores  PHQ - 2 Score 2 1  PHQ- 9 Score 6 2   No results found for any visits on 06/07/23.  Physical Exam ***    Assessment & Plan      Problem List Items Addressed This Visit   None                  No follow-ups on file.      Ronnald Ramp, MD  [facility] [phone] (phone) [phone] (fax)  [facility]

## 2023-08-11 ENCOUNTER — Ambulatory Visit
Admission: RE | Admit: 2023-08-11 | Discharge: 2023-08-11 | Disposition: A | Discharge status: Home / Self Care | Source: Ambulatory Visit | Attending: Medical | Admitting: Medical

## 2023-08-11 ENCOUNTER — Other Ambulatory Visit: Payer: Self-pay | Admitting: Medical

## 2023-08-11 ENCOUNTER — Other Ambulatory Visit: Payer: Self-pay

## 2023-08-11 ENCOUNTER — Ambulatory Visit: Payer: Self-pay | Admitting: Nurse Practitioner

## 2023-08-11 ENCOUNTER — Ambulatory Visit: Payer: Self-pay | Admitting: Medical

## 2023-08-11 VITALS — BP 122/84 | HR 139 | Temp 100.9°F | Wt 281.6 lb

## 2023-08-11 DIAGNOSIS — R000 Tachycardia, unspecified: Secondary | ICD-10-CM | POA: Diagnosis not present

## 2023-08-11 DIAGNOSIS — R1030 Lower abdominal pain, unspecified: Secondary | ICD-10-CM

## 2023-08-11 DIAGNOSIS — R509 Fever, unspecified: Secondary | ICD-10-CM | POA: Diagnosis present

## 2023-08-11 LAB — POCT URINALYSIS DIP (PROADVANTAGE DEVICE)
Bilirubin, UA: NEGATIVE
Blood, UA: NEGATIVE
Glucose, UA: NEGATIVE mg/dL
Ketones, POC UA: NEGATIVE mg/dL
Leukocytes, UA: NEGATIVE
Nitrite, UA: NEGATIVE
Protein Ur, POC: NEGATIVE mg/dL
Specific Gravity, Urine: 1.02
Urobilinogen, Ur: NEGATIVE
pH, UA: 6 (ref 5.0–8.0)

## 2023-08-11 LAB — CBC WITH DIFFERENTIAL/PLATELET
Basophils Absolute: 0 10*3/uL (ref 0.0–0.2)
Basos: 0 %
EOS (ABSOLUTE): 0 10*3/uL (ref 0.0–0.4)
Eos: 0 %
Hematocrit: 46.7 % (ref 37.5–51.0)
Hemoglobin: 16.1 g/dL (ref 13.0–17.7)
Lymphocytes Absolute: 0.8 10*3/uL (ref 0.7–3.1)
Lymphs: 6 %
MCH: 29 pg (ref 26.6–33.0)
MCHC: 34.5 g/dL (ref 31.5–35.7)
MCV: 84 fL (ref 79–97)
Monocytes Absolute: 0.9 10*3/uL (ref 0.1–0.9)
Monocytes: 7 %
Neutrophils Absolute: 10.6 10*3/uL — ABNORMAL HIGH (ref 1.4–7.0)
Neutrophils: 87 %
Platelets: 252 10*3/uL (ref 150–450)
RBC: 5.56 x10E6/uL (ref 4.14–5.80)
RDW: 15.1 % (ref 11.6–15.4)
WBC: 12.2 10*3/uL — ABNORMAL HIGH (ref 3.4–10.8)

## 2023-08-11 LAB — BASIC METABOLIC PANEL WITH GFR
BUN/Creatinine Ratio: 13 (ref 9–20)
BUN: 10 mg/dL (ref 6–20)
CO2: 26 mmol/L (ref 20–29)
Calcium: 10.2 mg/dL (ref 8.7–10.2)
Chloride: 98 mmol/L (ref 96–106)
Creatinine, Ser: 0.75 mg/dL — ABNORMAL LOW (ref 0.76–1.27)
Glucose: 100 mg/dL — ABNORMAL HIGH (ref 70–99)
Potassium: 4.1 mmol/L (ref 3.5–5.2)
Sodium: 138 mmol/L (ref 134–144)
eGFR: 128 mL/min/{1.73_m2} (ref >59)

## 2023-08-11 MED ORDER — IOHEXOL 300 MG/ML  SOLN
100.0000 mL | Freq: Once | INTRAMUSCULAR | Status: AC | PRN
  Administered 2023-08-11: 100 mL via INTRAVENOUS

## 2023-08-11 MED ORDER — ONDANSETRON 4 MG PO TBDP
4.0000 mg | ORAL_TABLET | Freq: Three times a day (TID) | ORAL | 0 refills | Status: DC | PRN
  Filled 2023-08-11: qty 18, 21d supply, fill #0

## 2023-08-11 MED ORDER — HYDROCODONE-ACETAMINOPHEN 10-325 MG PO TABS
1.0000 | ORAL_TABLET | Freq: Four times a day (QID) | ORAL | 0 refills | Status: DC | PRN
  Filled 2023-08-11: qty 12, 3d supply, fill #0

## 2023-08-11 NOTE — Progress Notes (Signed)
 Currently the CT abdomen pelvis shows some fatty changes of the liver but no obvious colitis, diverticulitis or appendicitis or other.  However the blood counts do show slightly elevated white counts and neutrophils.  Rest of blood counts okay.  I recommend no solid food for the next 12 to 24 hours.  He can use clear fluids to stay hydrated such as water, G2 Gatorade, soup, Pedialyte or other.  I prescribed pain medicine for worse pain that he can use as needed.  He could also take with over-the-counter Tylenol if the pain is milder.  He can use Zofran for nausea as needed.  The pain medicine can cause drowsiness, so be aware of this.  At this point it is a watch and wait situation  Given the elevated white count, this could be a very early start but something, but not obvious with the scan findings so far  If much worse overnight then get checked back at the emergency department.  Otherwise I would recommend he call us back tomorrow midmorning to give me an update on symptoms, or he can MyChart message midmorning  I discussed several scenarios today of how different things may change over the next day, so if worsening symptoms, he can either call or get checked back out  With bowel rest and good fluid intake, symptoms may calm down  At least currently he can be reassured nothing that needs urgent intervention.  I pray things will go more smoothly over the next few days and that he will be able to focus on his wife and the pregnancy

## 2023-08-11 NOTE — Telephone Encounter (Signed)
 Agree with evaluation asap

## 2023-08-11 NOTE — Telephone Encounter (Signed)
 Chief Complaint: Abdominal pain, chills, increased occurrences of bowel movements that appear like ribbons, low grade fever, increased HR Symptoms: see above Frequency: since last night Pertinent Negatives: Patient denies n/a Disposition: [] ED /[] Urgent Care (no appt availability in office) / [x] Appointment(In office/virtual)/ []  [facility]/ [] Home Care/ [] Refused Recommended Disposition /[] [facility]/ []  Follow-up with PCP Additional Notes: Patient called in stating since 9 pm last night he has had onset of worsening abdominal pain below belly button in the middle. Patient states he has not been diagnosed with GI issues in the past, and states he has felt the pain go from left side to right side also but mainly in the middle. Patient has not had diarrhea but has had increased occurrences of bowel movements since last night, and states it is appearance of "strands". Patient states he has had an increased temp of around 99.1 and a little higher last night. Patient's BP is running higher than usual, and he states his resting HR is sitting around 120. Patient recommended to get evaluated asap. Patient states his wife will be going into labor very soon and he wants to ensure he is healthy and able to attend.    Copied from CRM [id number]. Topic: Clinical - Red Word Triage >> Aug 11, 2023 12:06 PM Elizebeth Brooking wrote: [facility] that prompted transfer to Nurse Triage: Patient called in stating that he is experiencing chills, abdomen pain and cramping and been frequently going to the bathroom Reason for Disposition  [1] MILD-MODERATE pain AND [2] constant AND [3] present > 2 hours  Answer Assessment - Initial Assessment Questions 1. LOCATION: "Where does it hurt?"      Right below belly button mainly in middle 2. RADIATION: "Does the pain shoot anywhere else?" (e.g., chest, back)     No 3. ONSET: "When did the pain begin?" (Minutes, hours or days ago)      Last night 4. SUDDEN:  "Gradual or sudden onset?"     Gradual  5. PATTERN "Does the pain come and go, or is it constant?"    - If it comes and goes: "How long does it last?" "Do you have pain now?"     (Note: Comes and goes means the pain is intermittent. It goes away completely between bouts.)    - If constant: "Is it getting better, staying the same, or getting worse?"      (Note: Constant means the pain never goes away completely; most serious pain is constant and gets worse.)      Comes and goes in waves since last night 6. SEVERITY: "How bad is the pain?"  (e.g., Scale 1-10; mild, moderate, or severe)    - MILD (1-3): Doesn't interfere with normal activities, abdomen soft and not tender to touch.     - MODERATE (4-7): Interferes with normal activities or awakens from sleep, abdomen tender to touch.     - SEVERE (8-10): Excruciating pain, doubled over, unable to do any normal activities.       Ranging moderate to severe  7. RECURRENT SYMPTOM: "Have you ever had this type of stomach pain before?" If Yes, ask: "When was the last time?" and "What happened that time?"      N/a 8. CAUSE: "What do you think is causing the stomach pain?"     Patient is not sure 9. RELIEVING/AGGRAVATING FACTORS: "What makes it better or worse?" (e.g., antacids, bending or twisting motion, bowel movement)     Bowel movement 10.  OTHER SYMPTOMS: "Do you have any other symptoms?" (e.g., back pain, diarrhea, fever, urination pain, vomiting)       Chills, increased occurrence of bowel movements, low grade fever, high HR, mild nausea and lightheaded  Protocols used: Abdominal Pain - Male-A-AH

## 2023-08-11 NOTE — Progress Notes (Signed)
 Subjective:  Timothy Young is a 28 y.o. male who presents for Chief Complaint  Patient presents with   Abdominal Pain    Abdominal pain that will have some stabbing pain, and then woke up at 4am with chills. Having some loose stools but not diarrhea. Feels like he is constipation      Here for abdominal pain.  Prior to yesterday was in normal state of health.    Last night felt stabbing pain LLQ, awoke in middle of night with chills.  Used some ibuprofen 4:30am.  Has not taken any other medicines then.  This morning pain below belly button, occasional stabbing pain.  Pain has been intermittent.  Has felt cold and chills.  Has not felt feverish although today he has a fever on presentation.  He denies urinary symptoms.  Young frequency, urgency, hematuria, odor.  Young diarrhea, Young constipation but has felt kind of constipated.   He saw a small amount of possible blood on total 5 today but thought might be coming from hemorrhoid.  Young other frank blood in the stools.  He does have a history of hemorrhoids  He has some nausea, Young vomiting  Usually has 2-3 bowel movements a day but has had 6 bowel movements today.  Young recent sick contacts.  He does note that March 11 he had 1 day of vomiting nausea and loose stool and went away.  Young history of appendicitis, gallbladder disease or diverticulitis or colitis.  Young family history of bowel disease.  He is nervous today because he has his knee pain and his wife is pregnant about the going to labor.  She has had contractions 10 minutes apart and mucus came out.  So the timing could be worse.  He is a non-smoker, Young alcohol use.  He works as an Airline pilot.  Young smoke, Young etoh, accountant  Young other aggravating or relieving factors.    Young other c/o.   Past Medical History:  Diagnosis Date   Hypertension    Current Outpatient Medications on File Prior to Visit  Medication Sig Dispense Refill   losartan (COZAAR) 50 MG tablet Take 1 tablet (50  mg total) by mouth daily. 90 tablet 3   Young current facility-administered medications on file prior to visit.   Past Surgical History:  Procedure Laterality Date   TYMPANOSTOMY TUBE PLACEMENT      The following portions of the patient's history were reviewed and updated as appropriate: allergies, current medications, past family history, past medical history, past social history, past surgical history and problem list.  ROS Otherwise as in subjective above    Objective: BP 122/84   Pulse (!) 139   Temp (!) 100.9 F (38.3 C)   Wt 281 lb 9.6 oz (127.7 kg)   SpO2 99%   BMI 39.28 kg/m   General appearance: alert, Young distress, well developed, well nourished Heart: RRR, normal S1, S2, Young murmurs Lungs: CTA bilaterally, Young wheezes, rhonchi, or rales Abdomen: +bs, soft, mild lower mid abdominal tenderness, Young rebound, otherwise non tender, non distended, Young masses, Young hepatomegaly, Young splenomegaly Back: nontender Pulses: 2+ radial pulses, 2+ pedal pulses, normal cap refill Ext: Young edema   Assessment: Encounter Diagnoses  Name Primary?   Lower abdominal pain Yes   Fever and chills    Elevated pulse rate      Plan: We discussed symptoms, exam findings.  He is not all that tender at the moment but does have elevated pulse, chills and  fever on presentation today.  He was 101.6 on initial presentation.  Regarding his elevated pulse, he is more nervous than anything because of his current acute symptoms and the fact that his wife is pregnant and can could go into labor within the next 48 hours.  We discussed possible differential which could include acute gastroenteritis, appendicitis, colitis, diverticulitis, prostate infection, urinary tract infection or other.  Advised n.p.o. for now.  We discussed possible evaluation.  He is agreeable to pursue stat labs and CT abdomen pelvis stat.  I sent pain medicine and nausea medicine he can use as needed for worse symptoms.  Caution with  sedation with the pain medicine.  Can use other analgesics over-the-counter for less pain  Commodore was seen today for abdominal pain.  Diagnoses and all orders for this visit:  Lower abdominal pain -     CBC with Differential/Platelet -     Basic metabolic panel with GFR -     POCT Urinalysis DIP (Proadvantage Device) -     Cancel: CT ABDOMEN PELVIS W CONTRAST; Future -     CT ABDOMEN PELVIS W CONTRAST; Future  Fever and chills -     CBC with Differential/Platelet -     Basic metabolic panel with GFR -     POCT Urinalysis DIP (Proadvantage Device) -     Cancel: CT ABDOMEN PELVIS W CONTRAST; Future -     CT ABDOMEN PELVIS W CONTRAST; Future  Elevated pulse rate  Other orders -     HYDROcodone-acetaminophen (NORCO) 10-325 MG tablet; Take 1 tablet by mouth every 6 (six) hours as needed. -     ondansetron (ZOFRAN-ODT) 4 MG disintegrating tablet; Take 1 tablet (4 mg total) by mouth every 8 (eight) hours as needed for nausea or vomiting.    Follow up: pending labs, CT

## 2023-08-12 ENCOUNTER — Telehealth: Payer: Self-pay

## 2023-08-12 NOTE — Progress Notes (Signed)
 Call this morning maybe about 9:00 to check on his symptoms

## 2023-08-12 NOTE — Telephone Encounter (Signed)
 Copied from CRM [id number]. Topic: Clinical - Medical Advice >> Aug 12, 2023  3:42 PM Victorino Dike T wrote: Reason for CRM: returning message- wants to know if taking immodium would help with the issue or does it need to run its course. fever gone but has diarrhea, nausea is gone - wife is in labor and he is wanting to do anything to get it to pass- [phone]

## 2023-08-12 NOTE — Telephone Encounter (Signed)
 Called pt. Back and gave him the information about taking Imodium and on what to eat. He did not have any worsening pain or symptoms at this time.

## 2024-03-01 ENCOUNTER — Other Ambulatory Visit: Payer: Self-pay

## 2024-05-01 ENCOUNTER — Encounter: Payer: Self-pay | Admitting: Nurse Practitioner

## 2024-05-01 ENCOUNTER — Other Ambulatory Visit: Payer: Self-pay

## 2024-05-01 ENCOUNTER — Ambulatory Visit: Payer: BC Managed Care – PPO | Admitting: Nurse Practitioner

## 2024-05-01 VITALS — BP 136/86 | HR 106 | Temp 98.1°F | Ht 71.0 in | Wt 257.0 lb

## 2024-05-01 DIAGNOSIS — Z23 Encounter for immunization: Secondary | ICD-10-CM | POA: Diagnosis not present

## 2024-05-01 DIAGNOSIS — Z0000 Encounter for general adult medical examination without abnormal findings: Secondary | ICD-10-CM

## 2024-05-01 DIAGNOSIS — I10 Essential (primary) hypertension: Secondary | ICD-10-CM

## 2024-05-01 DIAGNOSIS — Z13220 Encounter for screening for lipoid disorders: Secondary | ICD-10-CM | POA: Diagnosis not present

## 2024-05-01 DIAGNOSIS — L729 Follicular cyst of the skin and subcutaneous tissue, unspecified: Secondary | ICD-10-CM | POA: Diagnosis not present

## 2024-05-01 DIAGNOSIS — F419 Anxiety disorder, unspecified: Secondary | ICD-10-CM | POA: Diagnosis not present

## 2024-05-01 DIAGNOSIS — Z131 Encounter for screening for diabetes mellitus: Secondary | ICD-10-CM | POA: Diagnosis not present

## 2024-05-01 LAB — CBC WITH DIFFERENTIAL/PLATELET
Basophils Absolute: 0 K/uL (ref 0.0–0.1)
Basophils Relative: 0.5 % (ref 0.0–3.0)
Eosinophils Absolute: 0.1 K/uL (ref 0.0–0.7)
Eosinophils Relative: 0.7 % (ref 0.0–5.0)
HCT: 43 % (ref 39.0–52.0)
Hemoglobin: 15 g/dL (ref 13.0–17.0)
Lymphocytes Relative: 27.9 % (ref 12.0–46.0)
Lymphs Abs: 2.1 K/uL (ref 0.7–4.0)
MCHC: 34.8 g/dL (ref 30.0–36.0)
MCV: 82.7 fl (ref 78.0–100.0)
Monocytes Absolute: 0.6 K/uL (ref 0.1–1.0)
Monocytes Relative: 8.3 % (ref 3.0–12.0)
Neutro Abs: 4.7 K/uL (ref 1.4–7.7)
Neutrophils Relative %: 62.6 % (ref 43.0–77.0)
Platelets: 218 K/uL (ref 150.0–400.0)
RBC: 5.2 Mil/uL (ref 4.22–5.81)
RDW: 13.9 % (ref 11.5–15.5)
WBC: 7.5 K/uL (ref 4.0–10.5)

## 2024-05-01 LAB — COMPREHENSIVE METABOLIC PANEL WITH GFR
ALT: 21 U/L (ref 0–53)
AST: 16 U/L (ref 5–37)
Albumin: 4.8 g/dL (ref 3.5–5.2)
Alkaline Phosphatase: 71 U/L (ref 39–117)
BUN: 10 mg/dL (ref 6–23)
CO2: 31 meq/L (ref 19–32)
Calcium: 10 mg/dL (ref 8.4–10.5)
Chloride: 101 meq/L (ref 96–112)
Creatinine, Ser: 0.74 mg/dL (ref 0.40–1.50)
GFR: 125.05 mL/min (ref >60.00)
Glucose, Bld: 94 mg/dL (ref 70–99)
Potassium: 3.8 meq/L (ref 3.5–5.1)
Sodium: 142 meq/L (ref 135–145)
Total Bilirubin: 0.6 mg/dL (ref 0.2–1.2)
Total Protein: 7.1 g/dL (ref 6.0–8.3)

## 2024-05-01 LAB — LIPID PANEL
Cholesterol: 161 mg/dL (ref 28–200)
HDL: 27.2 mg/dL — ABNORMAL LOW (ref >39.00)
LDL Cholesterol: 95 mg/dL (ref 0–99)
NonHDL: 134.12
Total CHOL/HDL Ratio: 6
Triglycerides: 194 mg/dL — ABNORMAL HIGH (ref 0.0–149.0)
VLDL: 38.8 mg/dL (ref 0.0–40.0)

## 2024-05-01 LAB — TSH: TSH: 1.13 u[IU]/mL (ref 0.35–5.50)

## 2024-05-01 LAB — HEMOGLOBIN A1C: Hgb A1c MFr Bld: 5 % (ref 4.6–6.5)

## 2024-05-01 MED ORDER — ONDANSETRON 4 MG PO TBDP
4.0000 mg | ORAL_TABLET | Freq: Three times a day (TID) | ORAL | 0 refills | Status: AC | PRN
  Filled 2024-05-01: qty 18, 23d supply, fill #0

## 2024-05-01 MED ORDER — SERTRALINE HCL 50 MG PO TABS
ORAL_TABLET | ORAL | 0 refills | Status: AC
  Filled 2024-05-01: qty 25, 30d supply, fill #0

## 2024-05-01 NOTE — Progress Notes (Signed)
 Established Patient Office Visit  Subjective   Patient ID: Timothy Young, male    DOB: 1995-05-30  Age: 28 y.o. MRN: [id number]  Chief Complaint  Patient presents with   Annual Exam    Flu and tdap vaccine     HPI  HTN: Patient currently maintained on losartan  50 mg daily. States that he has missed his medicaitno the past 3-4 days. States that he did take it this monring. States that the 38 month old is in the room and he will try to leave beofre waking her up. He is checking it infrequently at home and will get 118/70s and the highest 130/70s  for complete physical and follow up of chronic conditions.  Immunizations: -Tetanus: Completed in 2010, update today  -Influenza: update today  -Shingles: Too young -Pneumonia: Too young - HPV: Up-to-date  Diet: Fair diet. He is eating 3 meals and sometimes he will have 2 meals. He is tracking what he is eating. He is doing water and propel  Exercise:  He was walking daily with a weighted vest. States that he is running now. He can do a 5k and he is doing the running 3 times a week  Eye exam:  Glasses. He needs updating on eye exam  Dental exam: Completes semi-annually    Colonoscopy: Too young, currently average risk Lung Cancer Screening: N/A  PSA: Too young, currently average risk  Sleep: he will go to bed around 1030 and will get up around 6-7. He is feeling rested for the most part. States that it will take him an hour to get going and take a nap    Anxeity: states that he feels tha the has been dealing with anxiety since covid. States that this year he is having worry about throwing up. States that he will want to keep the child out for the fear of her getting sick and him getting it and throwing. Statse that he wants to stay home because of the fear of getting sick and vomiting. When he goes to the event he ejoys it      05/01/2024    8:46 AM 04/28/2023   12:33 PM 07/23/2015   10:41 PM  PHQ9 SCORE ONLY  PHQ-9 Total  Score 12 6  2       Data saved with a previous flowsheet row definition       05/01/2024    8:46 AM 04/28/2023   12:33 PM  GAD 7 : Generalized Anxiety Score  Nervous, Anxious, on Edge 3 1  Control/stop worrying 3 2  Worry too much - different things 3 2  Trouble relaxing 3 1  Restless 2 0  Easily annoyed or irritable 3 0  Afraid - awful might happen 0 0  Total GAD 7 Score 17 6  Anxiety Difficulty Somewhat difficult Not difficult at all        Review of Systems  Constitutional:  Negative for chills and fever.  Respiratory:  Negative for shortness of breath.   Cardiovascular:  Negative for chest pain and leg swelling.  Gastrointestinal:  Negative for abdominal pain, blood in stool, constipation, diarrhea, nausea and vomiting.       BM daily   Genitourinary:  Negative for dysuria and hematuria.  Neurological:  Negative for dizziness, tingling and headaches.  Psychiatric/Behavioral:  Negative for hallucinations and suicidal ideas.       Objective:     BP 136/86   Pulse (!) 106   Temp 98.1 F (36.7 C) (  Oral)   Ht 5' 11 (1.803 m)   Wt 257 lb (116.6 kg)   SpO2 98%   BMI 35.84 kg/m  BP Readings from Last 3 Encounters:  05/01/24 136/86  08/11/23 122/84  04/28/23 126/88   Wt Readings from Last 3 Encounters:  05/01/24 257 lb (116.6 kg)  08/11/23 281 lb 9.6 oz (127.7 kg)  04/28/23 285 lb (129.3 kg)   SpO2 Readings from Last 3 Encounters:  05/01/24 98%  08/11/23 99%  04/28/23 99%      Physical Exam Vitals and nursing note reviewed.  Constitutional:      Appearance: Normal appearance.  HENT:     Right Ear: Tympanic membrane, ear canal and external ear normal.     Left Ear: Tympanic membrane, ear canal and external ear normal.     Mouth/Throat:     Mouth: Mucous membranes are moist.     Pharynx: Oropharynx is clear.  Eyes:     Extraocular Movements: Extraocular movements intact.     Pupils: Pupils are equal, round, and reactive to light.  Cardiovascular:      Rate and Rhythm: Normal rate and regular rhythm.     Pulses: Normal pulses.     Heart sounds: Normal heart sounds.  Pulmonary:     Effort: Pulmonary effort is normal.     Breath sounds: Normal breath sounds.  Abdominal:     General: Bowel sounds are normal. There is no distension.     Palpations: There is no mass.     Tenderness: There is no abdominal tenderness.     Hernia: No hernia is present.  Musculoskeletal:     Right lower leg: No edema.     Left lower leg: No edema.  Lymphadenopathy:     Cervical: No cervical adenopathy.  Skin:    General: Skin is warm.     Findings: Lesion present.      Neurological:     General: No focal deficit present.     Mental Status: He is alert.     Deep Tendon Reflexes:     Reflex Scores:      Bicep reflexes are 2+ on the right side and 2+ on the left side.      Patellar reflexes are 2+ on the right side and 2+ on the left side.    Comments: Bilateral upper and lower extremity strength 5/5  Psychiatric:        Mood and Affect: Mood normal.        Behavior: Behavior normal.        Thought Content: Thought content normal.        Judgment: Judgment normal.      No results found for any visits on 05/01/24.    The ASCVD Risk score (Arnett DK, et al., 2019) failed to calculate for the following reasons:   The 2019 ASCVD risk score is only valid for ages 31 to 33   * - Cholesterol units were assumed    Assessment & Plan:   Problem List Items Addressed This Visit       Cardiovascular and Mediastinum   Primary hypertension   Patient currently maintained on losartan  50 mg daily.  Does check blood pressure intermittently at home with readings left reported within normal limits.  Continue losartan  50 mg daily.  Initial blood pressure elevated recheck within normal limits.  Patient having some adherence issues dealing with newborn at home encouraged daily use of medication as prescribed  Relevant Orders   Comprehensive metabolic  panel with GFR   CBC with Differential/Platelet   Hemoglobin A1c   Lipid panel     Other   Preventative health care - Primary   Discussed age-appropriate immunizations and screening exams.  Did review patient's personal, surgical, social, family histories.  Patient is up-to-date on all age-appropriate vaccinations he would like.  Update tetanus vaccine and flu vaccine today.  Patient is too young for CRC screening or prostate cancer screening.  Patient was given information at discharge about preventative healthcare maintenance with anticipatory guidance.      Relevant Orders   Comprehensive metabolic panel with GFR   CBC with Differential/Platelet   TSH   Morbid obesity (HCC)   BMI of 35+ with hypertension.  Patient is been working on lifestyle modifications tracking food and exercising appropriately.  Patient's lost approximately 30 pounds.  Continue work on healthy lifestyle modifications and weight reduction.      Relevant Orders   Hemoglobin A1c   Lipid panel   Anxiety   Longstanding but becoming more frequent and affecting life.  Patient has a fear of vomiting or getting sick.  Patient states this will keep him from doing certain activities.  If he does go to the activity he does enjoy it.  Patient is not having any sleep disturbances.  No HI/SI/AVH.  PHQ-9 and GAD-7 administered in office.  Will start patient on sertraline  25 mg daily for 10 days then increase to 50 mg daily thereafter.      Relevant Medications   sertraline  (ZOLOFT ) 50 MG tablet   Other Relevant Orders   TSH   Other Visit Diagnoses       Screening for lipid disorders       Relevant Orders   Lipid panel     Screening for diabetes mellitus       Relevant Orders   Hemoglobin A1c     Need for Tdap vaccination       Relevant Orders   Tdap vaccine greater than or equal to 7yo IM (Completed)     Cyst of skin         Need for influenza vaccination       Relevant Orders   Flu vaccine trivalent PF, 6mos and  older(Flulaval,Afluria,Fluarix,Fluzone) (Completed)       Return in about 6 weeks (around 06/12/2024) for Mood.    Adina Crandall, NP

## 2024-05-01 NOTE — Assessment & Plan Note (Signed)
 Patient currently maintained on losartan  50 mg daily.  Does check blood pressure intermittently at home with readings left reported within normal limits.  Continue losartan  50 mg daily.  Initial blood pressure elevated recheck within normal limits.  Patient having some adherence issues dealing with newborn at home encouraged daily use of medication as prescribed

## 2024-05-01 NOTE — Assessment & Plan Note (Signed)
 BMI of 35+ with hypertension.  Patient is been working on lifestyle modifications tracking food and exercising appropriately.  Patient's lost approximately 30 pounds.  Continue work on healthy lifestyle modifications and weight reduction.

## 2024-05-01 NOTE — Patient Instructions (Signed)
 Nice to see you today I will be in touch with the labs once I have them  Follow up with me in 6 weeks, sooner if you need me p

## 2024-05-01 NOTE — Assessment & Plan Note (Signed)
 Discussed age-appropriate immunizations and screening exams.  Did review patient's personal, surgical, social, family histories.  Patient is up-to-date on all age-appropriate vaccinations he would like.  Update tetanus vaccine and flu vaccine today.  Patient is too young for CRC screening or prostate cancer screening.  Patient was given information at discharge about preventative healthcare maintenance with anticipatory guidance.

## 2024-05-01 NOTE — Assessment & Plan Note (Signed)
 Longstanding but becoming more frequent and affecting life.  Patient has a fear of vomiting or getting sick.  Patient states this will keep him from doing certain activities.  If he does go to the activity he does enjoy it.  Patient is not having any sleep disturbances.  No HI/SI/AVH.  PHQ-9 and GAD-7 administered in office.  Will start patient on sertraline  25 mg daily for 10 days then increase to 50 mg daily thereafter.

## 2024-05-03 ENCOUNTER — Ambulatory Visit: Payer: Self-pay | Admitting: Nurse Practitioner

## 2024-05-07 ENCOUNTER — Encounter: Payer: Self-pay | Admitting: Nurse Practitioner

## 2024-05-07 ENCOUNTER — Emergency Department

## 2024-05-07 ENCOUNTER — Other Ambulatory Visit: Payer: Self-pay

## 2024-05-07 ENCOUNTER — Emergency Department
Admission: EM | Admit: 2024-05-07 | Discharge: 2024-05-07 | Disposition: A | Discharge status: Home / Self Care | Attending: Emergency Medicine | Admitting: Emergency Medicine

## 2024-05-07 DIAGNOSIS — I10 Essential (primary) hypertension: Secondary | ICD-10-CM | POA: Diagnosis not present

## 2024-05-07 DIAGNOSIS — J040 Acute laryngitis: Secondary | ICD-10-CM

## 2024-05-07 DIAGNOSIS — R110 Nausea: Secondary | ICD-10-CM | POA: Insufficient documentation

## 2024-05-07 DIAGNOSIS — D72829 Elevated white blood cell count, unspecified: Secondary | ICD-10-CM | POA: Insufficient documentation

## 2024-05-07 DIAGNOSIS — R55 Syncope and collapse: Secondary | ICD-10-CM | POA: Diagnosis present

## 2024-05-07 LAB — CBC WITH DIFFERENTIAL/PLATELET
Abs Immature Granulocytes: 0.08 K/uL — ABNORMAL HIGH (ref 0.00–0.07)
Basophils Absolute: 0.1 K/uL (ref 0.0–0.1)
Basophils Relative: 0 %
Eosinophils Absolute: 0.1 K/uL (ref 0.0–0.5)
Eosinophils Relative: 0 %
HCT: 47.5 % (ref 39.0–52.0)
Hemoglobin: 16.2 g/dL (ref 13.0–17.0)
Immature Granulocytes: 0 %
Lymphocytes Relative: 10 %
Lymphs Abs: 1.8 K/uL (ref 0.7–4.0)
MCH: 28.4 pg (ref 26.0–34.0)
MCHC: 34.1 g/dL (ref 30.0–36.0)
MCV: 83.3 fL (ref 80.0–100.0)
Monocytes Absolute: 1.4 K/uL — ABNORMAL HIGH (ref 0.1–1.0)
Monocytes Relative: 8 %
Neutro Abs: 15 K/uL — ABNORMAL HIGH (ref 1.7–7.7)
Neutrophils Relative %: 82 %
Platelets: 265 K/uL (ref 150–400)
RBC: 5.7 MIL/uL (ref 4.22–5.81)
RDW: 13 % (ref 11.5–15.5)
WBC: 18.4 K/uL — ABNORMAL HIGH (ref 4.0–10.5)
nRBC: 0 % (ref 0.0–0.2)

## 2024-05-07 LAB — BASIC METABOLIC PANEL WITH GFR
Anion gap: 13 (ref 5–15)
BUN: 9 mg/dL (ref 6–20)
CO2: 24 mmol/L (ref 22–32)
Calcium: 9.3 mg/dL (ref 8.9–10.3)
Chloride: 101 mmol/L (ref 98–111)
Creatinine, Ser: 0.71 mg/dL (ref 0.61–1.24)
GFR, Estimated: >60 mL/min
Glucose, Bld: 101 mg/dL — ABNORMAL HIGH (ref 70–99)
Potassium: 4.5 mmol/L (ref 3.5–5.1)
Sodium: 139 mmol/L (ref 135–145)

## 2024-05-07 LAB — GROUP A STREP BY PCR: Group A Strep by PCR: NOT DETECTED

## 2024-05-07 MED ORDER — SODIUM CHLORIDE 0.9 % IV BOLUS
1000.0000 mL | Freq: Once | INTRAVENOUS | Status: AC
  Administered 2024-05-07: 1000 mL via INTRAVENOUS

## 2024-05-07 NOTE — Telephone Encounter (Signed)
 Per chart review tab pt was seen at [facility] ED12/22/25; did note WBC was 18.4. CHRISTELLA Crandall NP and LOIS Gaskins NP who is covering for Adina is out of office sending note to Dr Laine Balls and [employer].

## 2024-05-07 NOTE — ED Notes (Signed)
 PT states he was feeling unwell throughout out the night with episodes of coughing and nausea.  Pt states he felt some throat tightness and chest discomfort.  Pt states he sat at the edge of the tub and has a near syncope episode but never fully had any LOC.  Pt states he feels okay now.

## 2024-05-07 NOTE — ED Notes (Signed)
 Lab called to obtains pt's LT green top due to pt being a difficult stick.   2 attempts were done by this paramedic and x1 attempt done by a RN

## 2024-05-07 NOTE — ED Triage Notes (Addendum)
 Pt arrives via POV ambulatory in triage c/o near syncopal epsiode.   Sts he had a cough and throat discomfort described as tightness yesterday. Went to sleep. Around 3 am woke up to go to the bathroom. Urinated and came back to bed. Returned to the bathroom d/t c/o nausea. Was sitting on the floor over the toilet when he became lightheaded and Everything went black. Sts he layed himself on the floor and was given a cool rag by his wife. Symptoms resolved 5-10 minutes. No LOC. No CP. No SOB. Denies s/s at time of triage.

## 2024-05-07 NOTE — ED Provider Notes (Signed)
 Emergency department handoff note  Care of this patient was signed out to me at the end of the previous provider shift.  All pertinent patient information was conveyed and all questions were answered.  Patient pending strep swab and chest x-ray.  Patient strep swab is negative and chest x-ray shows no evidence of acute abnormalities.  Patient is p.o. tolerant and without any further episodes of syncope or lightheadedness.  Patient agrees with plan for discharge at this time with outpatient primary care follow-up as needed.  Given patient's symptoms and hoarse voice, concern for possible laryngitis given sick contacts at home.  Patient given expectant management precautions and he expressed understanding.  Patient given strict return precautions and all questions answered prior to discharge  Dispo: Discharge home with PCP follow-up   Sabri Teal K, MD 05/07/24 1011

## 2024-05-07 NOTE — ED Provider Notes (Signed)
 "  [facility] Provider Note    Event Date/Time   First MD Initiated Contact with Patient 05/07/24 [phone]     (approximate)   History   Chief Complaint Near Syncope   HPI  AD GUTTMAN is a 28 y.o. male with past medical history of hypertension and anxiety who presents to the ED complaining of near syncope.  Patient reports that he has been feeling nauseous and occasionally lightheaded for the past Young of days as well as some tightness and discomfort in his throat.  He denies any fevers or chills and has not had any chest pain or shortness of breath.  He then tried to go to the bathroom early this morning, began feeling nauseous at that time with increasing lightheadedness.  He states that his vision started to go black and he had to lay down on the floor of the bathroom, but he never fully lost consciousness.  Since arriving to the ED, he states that he feels back to normal.     Physical Exam   Triage Vital Signs: ED Triage Vitals  Encounter Vitals Group     BP --      Girls Systolic BP Percentile --      Girls Diastolic BP Percentile --      Boys Systolic BP Percentile --      Boys Diastolic BP Percentile --      Pulse Rate 05/07/24 0433 88     Resp 05/07/24 0433 16     Temp 05/07/24 0436 97.7 F (36.5 C)     Temp Source 05/07/24 0436 Oral     SpO2 05/07/24 0433 100 %     Weight --      Height --      Head Circumference --      Peak Flow --      Pain Score 05/07/24 0436 3     Pain Loc --      Pain Education --      Exclude from Growth Chart --     Most recent vital signs: Vitals:   05/07/24 0436 05/07/24 0437  BP: (!) 145/91   Pulse: 83 85  Resp: 16   Temp: 97.7 F (36.5 C)   SpO2: 100% 100%    Constitutional: Alert and oriented. Eyes: Conjunctivae are normal. Head: Atraumatic. Nose: No congestion/rhinnorhea. Mouth/Throat: Mucous membranes are moist.  Cardiovascular: Normal rate, regular rhythm. Grossly normal heart sounds.   2+ radial pulses bilaterally. Respiratory: Normal respiratory effort.  No retractions. Lungs CTAB. Gastrointestinal: Soft and nontender. No distention. Musculoskeletal: No lower extremity tenderness nor edema.  Neurologic:  Normal speech and language. No gross focal neurologic deficits are appreciated.    ED Results / Procedures / Treatments   Labs (all labs ordered are listed, but only abnormal results are displayed) Labs Reviewed  CBC WITH DIFFERENTIAL/PLATELET - Abnormal; Notable for the following components:      Result Value   WBC 18.4 (*)    Neutro Abs 15.0 (*)    Monocytes Absolute 1.4 (*)    Abs Immature Granulocytes 0.08 (*)    All other components within normal limits  GROUP A STREP BY PCR  BASIC METABOLIC PANEL WITH GFR     EKG  ED ECG REPORT I, Carlin Palin, the attending physician, personally viewed and interpreted this ECG.   Date: 05/07/2024  EKG Time: 4:33  Rate: 85  Rhythm: normal sinus rhythm  Axis: Normal  Intervals:none  ST&T Change: Nonspecific T  wave abnormality  RADIOLOGY Chest x-ray reviewed and interpreted by me with no infiltrate, edema, or effusion.  PROCEDURES:  Critical Care performed: No  Procedures   MEDICATIONS ORDERED IN ED: Medications  sodium chloride  0.9 % bolus 1,000 mL (1,000 mLs Intravenous New Bag/Given 05/07/24 0541)     IMPRESSION / MDM / ASSESSMENT AND PLAN / ED COURSE  I reviewed the triage vital signs and the nursing notes.                              28 y.o. male with past medical history of hypertension and anxiety who presents to the ED with a Young days of sore throat and nausea, had near syncopal episode going to the bathroom this morning.  Patient's presentation is most consistent with acute presentation with potential threat to life or bodily function.  Differential diagnosis includes, but is not limited to, viral illness, anemia, electrolyte abnormality, AKI, orthostatic hypotension, vasovagal  episode, dehydration.  Patient well-appearing and in no acute distress, vital signs are unremarkable.  EKG shows no evidence of arrhythmia or ischemia and I have low suspicion for cardiac etiology for his episode.  Vasovagal episode versus dehydration seems more likely, will screen labs and hydrate with IV fluids.  Labs without significant anemia but he does have a leukocytosis.  Chest x-ray is negative for acute process, strep testing is pending.  BMP also pending at this time, but patient does report feeling better following IV fluids.  Patient turned over to oncoming provider pending strep and BMP results, if these are unremarkable then he would be appropriate for discharge home.      FINAL CLINICAL IMPRESSION(S) / ED DIAGNOSES   Final diagnoses:  Near syncope     Rx / DC Orders   ED Discharge Orders     None        Note:  This document was prepared using Dragon voice recognition software and may include unintentional dictation errors.   Willo Dunnings, MD 05/07/24 [phone]  "

## 2024-05-07 NOTE — Telephone Encounter (Signed)
 Noted. Agree with holding the medication and having the patient schedule a follow up with me please

## 2024-05-11 NOTE — Telephone Encounter (Signed)
 Lvm to schedule appt.

## 2024-05-11 NOTE — Telephone Encounter (Signed)
 Lvm and sent mychart to schedule with Dr Watt

## 2024-06-21 ENCOUNTER — Ambulatory Visit: Admitting: Nurse Practitioner
